# Patient Record
Sex: Male | Born: 2000 | Hispanic: No | Marital: Single | Smoking: Current every day smoker
Health system: Southern US, Community
[De-identification: ages and names within clinical notes are randomized; demographics above are authoritative.]

## PROBLEM LIST (undated history)

## (undated) DIAGNOSIS — J302 Other seasonal allergic rhinitis: Secondary | ICD-10-CM

## (undated) HISTORY — PX: NO PAST SURGERIES: SHX2092

---
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
## 2022-01-10 ENCOUNTER — Ambulatory Visit
Admission: EM | Admit: 2022-01-10 | Discharge: 2022-01-10 | Disposition: A | Discharge status: Home / Self Care | Payer: BC Managed Care – PPO

## 2022-01-10 DIAGNOSIS — J039 Acute tonsillitis, unspecified: Secondary | ICD-10-CM

## 2022-01-10 MED ORDER — IBUPROFEN 600 MG PO TABS: 600.0000 mg | ORAL_TABLET | Freq: Four times a day (QID) | ORAL | 0 refills | Status: AC | PRN

## 2022-01-10 MED ORDER — LIDOCAINE VISCOUS HCL 2 % MT SOLN: 15.0000 mL | OROMUCOSAL | 0 refills | Status: AC | PRN

## 2022-01-10 NOTE — ED Triage Notes (Addendum)
Patient c.o sore throat and fever. Patient was see by PCP Wednesday 01/07/2022 and was tested for strep, COVID and flu -- all negative -- Patient [place] currently on an antibiotic.  ?

## 2022-01-10 NOTE — Discharge Instructions (Signed)
-  Keep taking antibiotics and prednisone. Add ibuprofen, viscous lidocaine and 1 g Tylenol 3x daily.  ?-Cool liquids and rest ?-ER for worsening symptoms or if you cannot swallow your spit, have facial or neck swelling ?

## 2022-01-10 NOTE — ED Provider Notes (Signed)
?[facility] ? ? ? ?CSN: [id number] ?Arrival date & time: 01/10/22  1549 ? ? ?  ? ?History   ?Chief Complaint ?No chief complaint on file. ? ? ?HPI ?Darrell Combs is a 21 y.o. male presenting for approximately 5-day history of sore throat, swollen tonsils, fatigue, body aches and feeling feverish.  Patient was seen 3 days ago by PCP and had negative COVID, flu and strep testing.  Patient was put on cefdinir and prednisone.  He has also been taking Tylenol but says he is not feeling any better.  Denies any recorded temperatures, cough, congestion, breathing difficulty or trouble swallowing secretions, neck swelling, vomiting or diarrhea.  Patient is concerned because he does not feel any better and wonders if he possibly needs another strep test.  Denies any sick contacts.  No other complaints. ? ?HPI ? ?Past Medical History:  ?Diagnosis Date  ? Seasonal allergies   ? ? ?There are no problems to display for this patient. ? ? ?Past Surgical History:  ?Procedure Laterality Date  ? NO PAST SURGERIES    ? ? ? ? ? ?Home Medications   ? ?Prior to Admission medications   ?Medication Sig Start Date End Date Taking? Authorizing Provider  ?cefdinir (OMNICEF) 300 MG capsule Take 300 mg by mouth 2 (two) times daily. 01/08/22  Yes [provider]  ?cetirizine (ZYRTEC) 10 MG tablet Take 10 mg by mouth daily.   Yes [provider]  ?ciprofloxacin (CILOXAN) 0.3 % ophthalmic solution Administer 1 drop, every 2 hours, while awake, for 2 days. Then 1 drop, every 4 hours, while awake, for the next 5 days. 07/12/20  Yes Danton Clap, PA-C  ?Creatine POWD Take by mouth. 1-2 scoops daily   Yes [provider]  ?ibuprofen (ADVIL) 600 MG tablet Take 1 tablet (600 mg total) by mouth every 6 (six) hours as needed for up to 5 days. 01/10/22 01/15/22 Yes Laurene Footman B, PA-C  ?lidocaine (XYLOCAINE) 2 % solution Use as directed 15 mLs in the mouth or throat every 3 (three) hours as needed for mouth pain  (swish and spit). 01/10/22  Yes Laurene Footman B, PA-C  ?oxybutynin (DITROPAN-XL) 10 MG 24 hr tablet Take 10 mg by mouth daily. 07/02/20  Yes [provider]  ? ? ?Family History ?Family History  ?Problem Relation Age of Onset  ? Healthy Mother   ? Other Father   ?     unknown medical history  ? ? ?Social History ?Social History  ? ?Tobacco Use  ? Smoking status: Every Day  ?  Types: Cigarettes  ? Smokeless tobacco: Never  ?Vaping Use  ? Vaping Use: Every day  ? Substances: Nicotine, Flavoring  ?Substance Use Topics  ? Alcohol use: Yes  ? Drug use: Never  ? ? ? ?Allergies   ?Patient has no known allergies. ? ? ?Review of Systems ?Review of Systems  ?Constitutional:  Positive for fatigue. Negative for fever.  ?HENT:  Positive for sore throat. Negative for congestion, rhinorrhea, sinus pressure and sinus pain.   ?Respiratory:  Negative for cough and shortness of breath.   ?Gastrointestinal:  Negative for abdominal pain, diarrhea, nausea and vomiting.  ?Musculoskeletal:  Positive for myalgias.  ?Neurological:  Negative for weakness, light-headedness and headaches.  ?Hematological:  Negative for adenopathy.  ? ? ?Physical Exam ?Triage Vital Signs ?ED Triage Vitals  ?Enc Vitals Group  ?   BP 01/10/22 1606 124/84  ?   Pulse Rate 01/10/22 1606 83  ?  Resp 01/10/22 1606 20  ?   Temp 01/10/22 1606 98.5 ?F (36.9 ?C)  ?   Temp Source 01/10/22 1606 Oral  ?   SpO2 01/10/22 1606 99 %  ?   Weight 01/10/22 1603 150 lb (68 kg)  ?   Height 01/10/22 1603 6' (1.829 m)  ?   Head Circumference --   ?   Peak Flow --   ?   Pain Score 01/10/22 1603 7  ?   Pain Loc --   ?   Pain Edu? --   ?   Excl. in [place]? --   ? ?No data found. ? ?Updated Vital Signs ?BP 124/84 (BP Location: Left Arm)   Pulse 83   Temp 98.5 ?F (36.9 ?C) (Oral)   Resp 20   Ht 6' (1.829 m)   Wt 150 lb (68 kg)   SpO2 99%   BMI 20.34 kg/m?  ?   ? ?Physical Exam ?Vitals and nursing note reviewed.  ?Constitutional:   ?   General: He is not in acute distress. ?    Appearance: Normal appearance. He is well-developed. He is ill-appearing.  ?HENT:  ?   Head: Normocephalic and atraumatic.  ?   Nose: Nose normal.  ?   Mouth/Throat:  ?   Mouth: Mucous membranes are moist.  ?   Pharynx: Oropharynx is clear. Posterior oropharyngeal erythema present.  ?   Tonsils: 2+ on the right. 2+ on the left.  ?Eyes:  ?   General: No scleral icterus. ?   Conjunctiva/sclera: Conjunctivae normal.  ?Cardiovascular:  ?   Rate and Rhythm: Normal rate and regular rhythm.  ?   Heart sounds: Normal heart sounds.  ?Pulmonary:  ?   Effort: Pulmonary effort is normal. No respiratory distress.  ?   Breath sounds: Normal breath sounds.  ?Musculoskeletal:     ?   General: No swelling.  ?   Cervical back: Neck supple.  ?Lymphadenopathy:  ?   Cervical: Cervical adenopathy present.  ?Skin: ?   General: Skin is warm and dry.  ?   Capillary Refill: Capillary refill takes less than 2 seconds.  ?Neurological:  ?   General: No focal deficit present.  ?   Mental Status: He is alert. Mental status is at baseline.  ?   Motor: No weakness.  ?   Gait: Gait normal.  ?Psychiatric:     ?   Mood and Affect: Mood normal.     ?   Behavior: Behavior normal.     ?   Thought Content: Thought content normal.  ? ? ? ?UC Treatments / Results  ?Labs ?(all labs ordered are listed, but only abnormal results are displayed) ?Labs Reviewed - No data to display ? ?EKG ? ? ?Radiology ?No results found. ? ?Procedures ?Procedures (including critical care time) ? ?Medications Ordered in UC ?Medications - No data to display ? ?Initial Impression / Assessment and Plan / UC Course  ?I have reviewed the triage vital signs and the nursing notes. ? ?Pertinent labs & imaging results that were available during my care of the patient were reviewed by me and considered in my medical decision making (see chart for details). ? ?21 year old male presenting for 4-day history of sore throat, swollen tonsils, fatigue and body aches as well as feeling feverish.   Patient seen 3 days ago by PCP and had negative strep, COVID and flu testing.  Is being treated anyway with cefdinir and prednisone as well as taking  OTC Tylenol but says he is not feeling any better.  Vitals normal and stable today.  He is ill-appearing but nontoxic.  On exam he does have 2+ bilateral enlarged tonsils without any evidence obstruction of airway and no respiratory distress.  Erythema also noted.  Tender and enlarged bilateral anterior cervical lymph nodes.  Chest clear to auscultation.  Advised patient that we could test for mono but it would not change management of condition.  Advised no need to retest for strep as he is already on antibiotics.  Advised I could increase his dose of prednisone but he declines stating that it makes him too hungry.  Advised to consider viscous lidocaine and since he is on a lower dose of prednisone it would be okay for him to start taking ibuprofen.  Advised patient symptoms likely viral and will need to run its course.  ED precautions reviewed. ? ? ?Final Clinical Impressions(s) / UC Diagnoses  ? ?Final diagnoses:  ?Acute tonsillitis, unspecified etiology  ? ? ? ?Discharge Instructions   ? ?  ?-Keep taking antibiotics and prednisone. Add ibuprofen, viscous lidocaine and 1 g Tylenol 3x daily.  ?-Cool liquids and rest ?-ER for worsening symptoms or if you cannot swallow your spit, have facial or neck swelling ? ? ? ? ?ED Prescriptions   ? ? Medication Sig Dispense Auth. Provider  ? lidocaine (XYLOCAINE) 2 % solution Use as directed 15 mLs in the mouth or throat every 3 (three) hours as needed for mouth pain (swish and spit). 100 mL Laurene Footman B, PA-C  ? ibuprofen (ADVIL) 600 MG tablet Take 1 tablet (600 mg total) by mouth every 6 (six) hours as needed for up to 5 days. 20 tablet Danton Clap, PA-C  ? ?  ? ?PDMP not reviewed this encounter. ?  ?Danton Clap, PA-C ?01/11/22 0749 ? ?

## 2022-04-16 ENCOUNTER — Encounter: Payer: Self-pay | Admitting: Emergency Medicine

## 2022-04-16 ENCOUNTER — Ambulatory Visit
Admission: EM | Admit: 2022-04-16 | Discharge: 2022-04-16 | Disposition: A | Discharge status: Home / Self Care | Payer: BC Managed Care – PPO | Attending: Family Medicine | Admitting: Family Medicine

## 2022-04-16 DIAGNOSIS — W57XXXA Bitten or stung by nonvenomous insect and other nonvenomous arthropods, initial encounter: Secondary | ICD-10-CM | POA: Diagnosis not present

## 2022-04-16 DIAGNOSIS — S90862A Insect bite (nonvenomous), left foot, initial encounter: Secondary | ICD-10-CM | POA: Diagnosis not present

## 2022-04-16 MED ORDER — DOXYCYCLINE HYCLATE 100 MG PO CAPS: 100.0000 mg | ORAL_CAPSULE | Freq: Two times a day (BID) | ORAL | 0 refills | Status: AC

## 2022-04-16 NOTE — ED Triage Notes (Signed)
Pt reports a tick bite on left foot in between great toe and second toe. States the bite occurred 1 week ago and he noticed it was red and swollen. Denies any pain and fever.

## 2022-04-16 NOTE — Discharge Instructions (Addendum)
Stop by the pharmacy to pick up your prescriptions.  Follow up with your primary care provider as needed.  

## 2022-04-16 NOTE — ED Provider Notes (Signed)
[facility]    CSN: [id number] Arrival date & time: 04/16/22  1914      History   Chief Complaint Chief Complaint  Patient presents with   Insect Bite    HPI Darrell Combs is a 21 y.o. male.   HPI  [name] presents for bug bite that he noticed about 3 days ago.  States that he pulled a tick off of him.  He works in Aeronautical engineer.  Endorses pain, swelling and redness between his first and second toes on the left foot.  He has otherwise been well.  He has pulled ticks off him before.  Denies fever, chills, nausea, vomiting, diarrhea, abdominal pain, headache, back pain, joint pain, neck pain and cough.   Past Medical History:  Diagnosis Date   Seasonal allergies     There are no problems to display for this patient.   Past Surgical History:  Procedure Laterality Date   NO PAST SURGERIES         Home Medications    Prior to Admission medications   Medication Sig Start Date End Date Taking? Authorizing Provider  doxycycline (VIBRAMYCIN) 100 MG capsule Take 1 capsule (100 mg total) by mouth 2 (two) times daily for 14 days. 04/16/22 04/30/22 Yes Vivian Okelley, DO  cefdinir (OMNICEF) 300 MG capsule Take 300 mg by mouth 2 (two) times daily. 01/08/22   [provider]  cetirizine (ZYRTEC) 10 MG tablet Take 10 mg by mouth daily.    [provider]  ciprofloxacin (CILOXAN) 0.3 % ophthalmic solution Administer 1 drop, every 2 hours, while awake, for 2 days. Then 1 drop, every 4 hours, while awake, for the next 5 days. 07/12/20   Eusebio Friendly B, PA-C  Creatine POWD Take by mouth. 1-2 scoops daily    [provider]  lidocaine (XYLOCAINE) 2 % solution Use as directed 15 mLs in the mouth or throat every 3 (three) hours as needed for mouth pain (swish and spit). 01/10/22   Shirlee Latch, PA-C  oxybutynin (DITROPAN-XL) 10 MG 24 hr tablet Take 10 mg by mouth daily. 07/02/20   [provider]    Family History Family History  Problem  Relation Age of Onset   Healthy Mother    Other Father        unknown medical history    Social History Social History   Tobacco Use   Smoking status: Every Day    Types: Cigarettes   Smokeless tobacco: Never  Vaping Use   Vaping Use: Every day   Substances: Nicotine, Flavoring  Substance Use Topics   Alcohol use: Yes   Drug use: Never     Allergies   Patient has no known allergies.   Review of Systems Review of Systems : :negative unless otherwise stated in HPI.      Physical Exam Triage Vital Signs ED Triage Vitals  Enc Vitals Group     BP 04/16/22 1932 120/69     Pulse Rate 04/16/22 1932 78     Resp 04/16/22 1932 18     Temp 04/16/22 1932 98.4 F (36.9 C)     Temp Source 04/16/22 1932 Oral     SpO2 04/16/22 1932 100 %     Weight --      Height --      Head Circumference --      Peak Flow --      Pain Score 04/16/22 1931 0     Pain Loc --  Pain Edu? --      Excl. in [place]? --    No data found.  Updated Vital Signs BP 120/69 (BP Location: Left Arm)   Pulse 78   Temp 98.4 F (36.9 C) (Oral)   Resp 18   SpO2 100%   Visual Acuity Right Eye Distance:   Left Eye Distance:   Bilateral Distance:    Right Eye Near:   Left Eye Near:    Bilateral Near:     Physical Exam  GEN: well appearing male in no acute distress  CVS: well perfused  RESP: speaking in full sentences without pause, no respiratory distress  SKIN: Between the left first and second toes patient had a small papule with surrounding erythema and edema   UC Treatments / Results  Labs (all labs ordered are listed, but only abnormal results are displayed) Labs Reviewed - No data to display  EKG   Radiology No results found.  Procedures Procedures (including critical care time)  Medications Ordered in UC Medications - No data to display  Initial Impression / Assessment and Plan / UC Course  I have reviewed the triage vital signs and the nursing notes.  Pertinent labs &  imaging results that were available during my care of the patient were reviewed by me and considered in my medical decision making (see chart for details).     Patient is a 21 year old male who works in Aeronautical engineer who presented for bug bite.  He reports pulling a tick off of him as he has pulled them off before.  Exam, he has erythema and edema between his first and second toes.  We will treat him with doxycycline.  Final Clinical Impressions(s) / UC Diagnoses   Final diagnoses:  Tick bite, unspecified site, initial encounter     Discharge Instructions      Stop by the pharmacy to pick up your prescriptions.  Follow up with your primary care provider as needed.      ED Prescriptions     Medication Sig Dispense Auth. Provider   doxycycline (VIBRAMYCIN) 100 MG capsule Take 1 capsule (100 mg total) by mouth 2 (two) times daily for 14 days. 28 capsule Miranda Garber, DO      PDMP not reviewed this encounter.   Katha Cabal, DO 04/16/22 2038
